# Patient Record
Sex: Female | Born: 1940
Health system: Southern US, Community
[De-identification: ages and names within clinical notes are randomized; demographics above are authoritative.]

---

## 2013-09-11 ENCOUNTER — Observation Stay: Payer: Self-pay | Admitting: Internal Medicine

## 2013-09-11 LAB — COMPREHENSIVE METABOLIC PANEL
Albumin: 3.8 g/dL (ref 3.4–5.0)
Alkaline Phosphatase: 73 U/L (ref 50–136)
Anion Gap: 3 — ABNORMAL LOW (ref 7–16)
Chloride: 106 mmol/L (ref 98–107)
EGFR (African American): >60
EGFR (Non-African Amer.): >60
Osmolality: 280 (ref 275–301)
SGOT(AST): 29 U/L (ref 15–37)

## 2013-09-11 LAB — MAGNESIUM: Magnesium: 1.9 mg/dL

## 2013-09-11 LAB — CBC
HCT: 40.7 % (ref 35.0–47.0)
HGB: 13.6 g/dL (ref 12.0–16.0)
MCHC: 33.3 g/dL (ref 32.0–36.0)
RBC: 4.87 10*6/uL (ref 3.80–5.20)
WBC: 8 10*3/uL (ref 3.6–11.0)

## 2013-09-11 LAB — APTT: Activated PTT: 27 secs (ref 23.6–35.9)

## 2013-09-11 LAB — CALCIUM: Calcium, Total: 9.5 mg/dL (ref 8.5–10.1)

## 2013-09-11 LAB — PROTIME-INR
INR: 1
Prothrombin Time: 13.3 secs (ref 11.5–14.7)

## 2013-09-11 LAB — PRO B NATRIURETIC PEPTIDE: B-Type Natriuretic Peptide: 74 pg/mL (ref 0–125)

## 2013-09-12 DIAGNOSIS — I059 Rheumatic mitral valve disease, unspecified: Secondary | ICD-10-CM

## 2013-09-12 LAB — COMPREHENSIVE METABOLIC PANEL WITH GFR
Albumin: 3.5 g/dL
Alkaline Phosphatase: 70 U/L
Anion Gap: 4 — ABNORMAL LOW
BUN: 19 mg/dL — ABNORMAL HIGH
Bilirubin,Total: 0.4 mg/dL
Calcium, Total: 9.1 mg/dL
Chloride: 104 mmol/L
Co2: 29 mmol/L
Creatinine: 1.17 mg/dL
EGFR (African American): 55 — ABNORMAL LOW
EGFR (Non-African Amer.): 47 — ABNORMAL LOW
Glucose: 292 mg/dL — ABNORMAL HIGH
Osmolality: 287
Potassium: 4 mmol/L
SGOT(AST): 19 U/L
SGPT (ALT): 20 U/L
Sodium: 137 mmol/L
Total Protein: 7.5 g/dL

## 2013-09-12 LAB — CBC WITH DIFFERENTIAL/PLATELET
Basophil #: 0 10*3/uL (ref 0.0–0.1)
Eosinophil #: 0 10*3/uL (ref 0.0–0.7)
Eosinophil %: 0.3 %
HCT: 37.3 % (ref 35.0–47.0)
HGB: 12.4 g/dL (ref 12.0–16.0)
Lymphocyte #: 0.5 10*3/uL — ABNORMAL LOW (ref 1.0–3.6)
Lymphocyte %: 8.3 %
MCH: 27.7 pg (ref 26.0–34.0)
MCHC: 33.1 g/dL (ref 32.0–36.0)
MCV: 84 fL (ref 80–100)
Monocyte #: 0.1 x10 3/mm — ABNORMAL LOW (ref 0.2–0.9)
Monocyte %: 2.1 %
Neutrophil #: 5.8 10*3/uL (ref 1.4–6.5)
Platelet: 146 10*3/uL — ABNORMAL LOW (ref 150–440)
RBC: 4.46 10*6/uL (ref 3.80–5.20)

## 2013-09-12 LAB — TROPONIN I: Troponin-I: 0.05 ng/mL

## 2015-01-28 IMAGING — CT CT CHEST W/ CM
1 series · 16 of 33 positions shown, 20 images · IV contrast (agent unspecified)
Comparison: none

REASON FOR EXAM: dyspnea, PULM EMBOLISM PROTOCOL
COMMENTS:

PROCEDURE:     CT  - CT CHEST WITH CONTRAST  - September 11, 2013  [DATE]
RESULT:     Chest CT PE protocol dated 12/19/2012.
TECHNIQUE: Helical 3 mm sections were obtained from the thoracic inlet to
the lung bases status post intravenous administration of 100 mL of
[id number].

[Series 5: soft tissue · axial · 0.69mm/px · z∈[-2,+298]mm · 16 of 108 slices shown, 20 images]
[im 4/108  mediastinal]
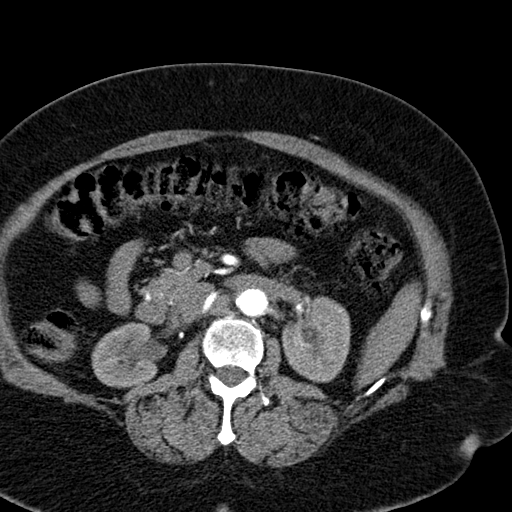
[im 4/108  lung]
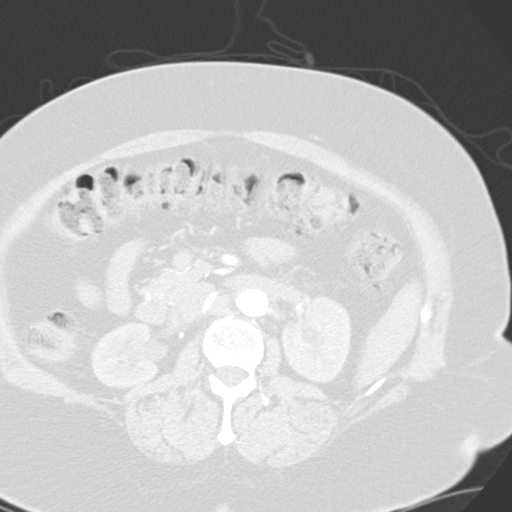
[im 12/108  lung]
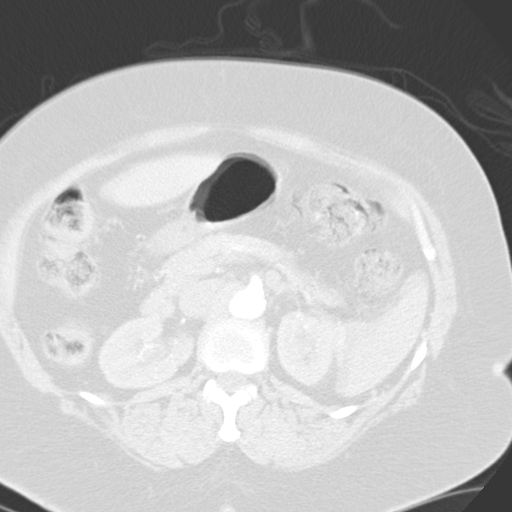
[im 20/108  lung]
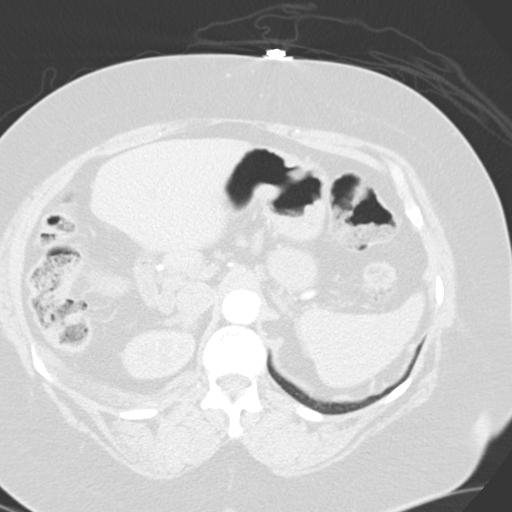
[im 24/108  lung]
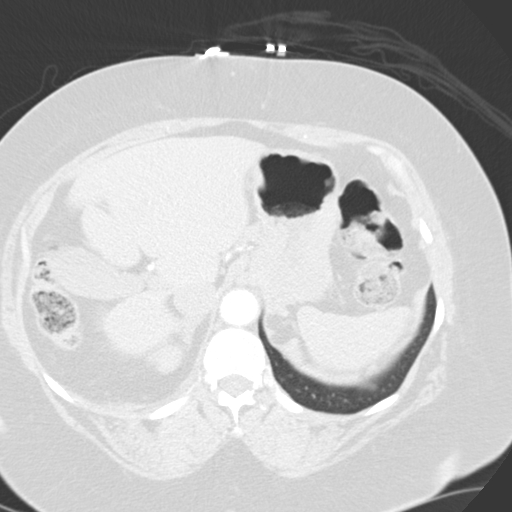
[im 32/108  mediastinal]
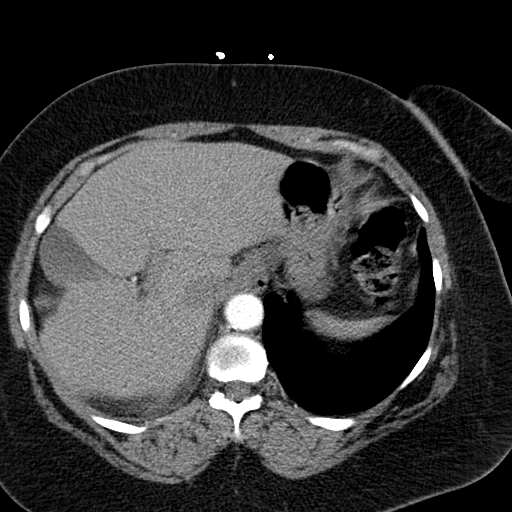
[im 32/108  lung]
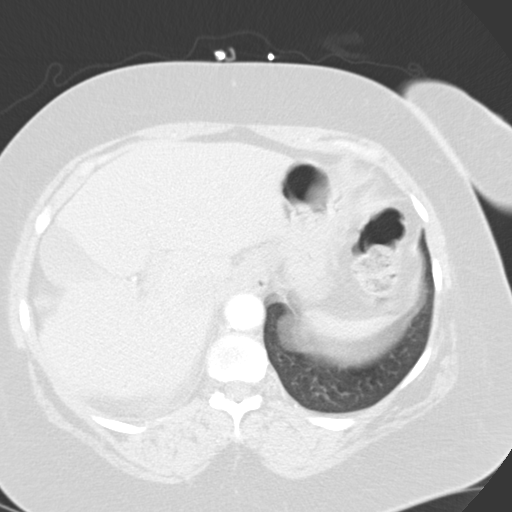
[im 40/108  lung]
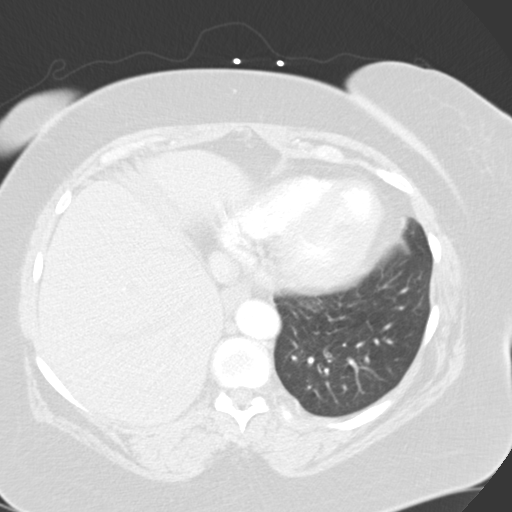
[im 44/108  lung]
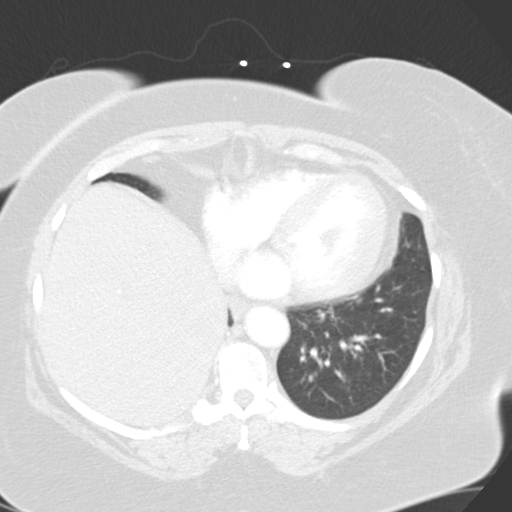
[im 52/108  lung]
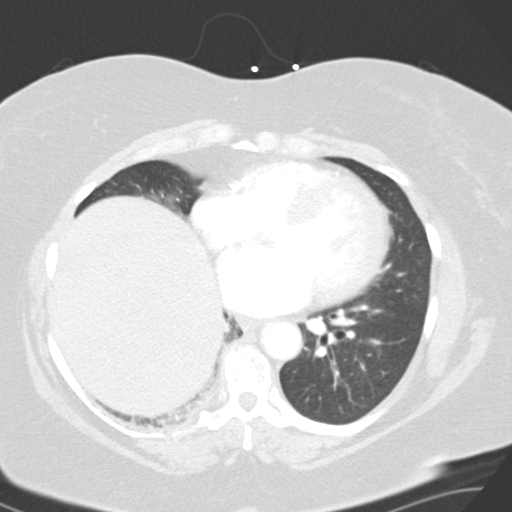
[im 57/108  mediastinal]
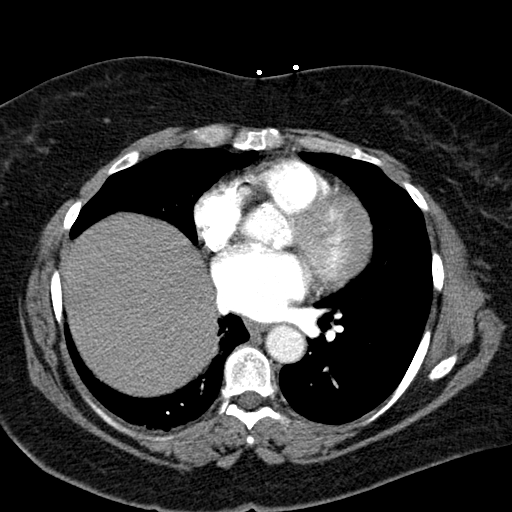
[im 57/108  lung]
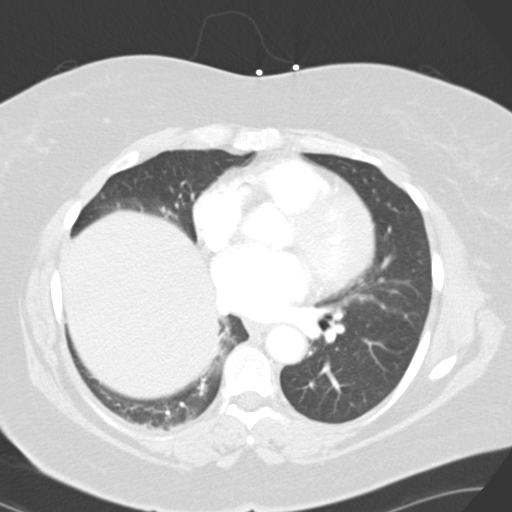
[im 64/108  lung]
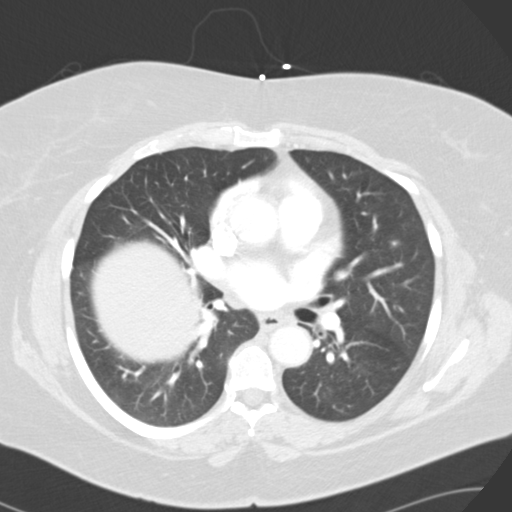
[im 68/108  lung]
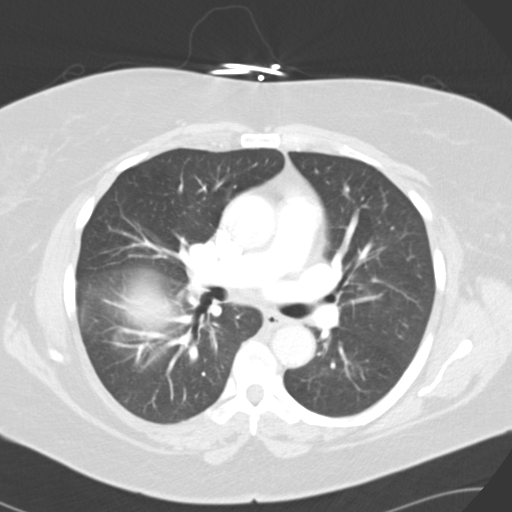
[im 76/108  lung]
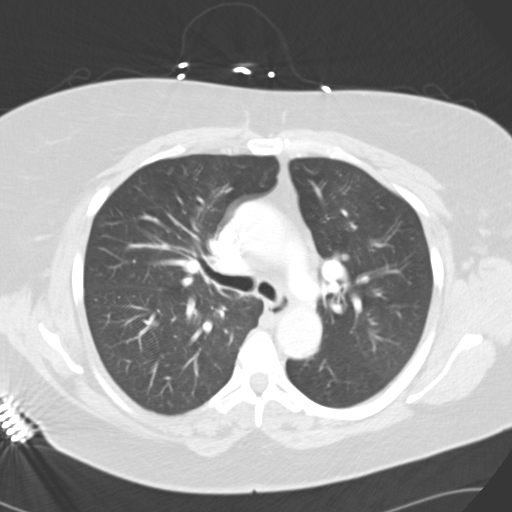
[im 84/108  mediastinal]
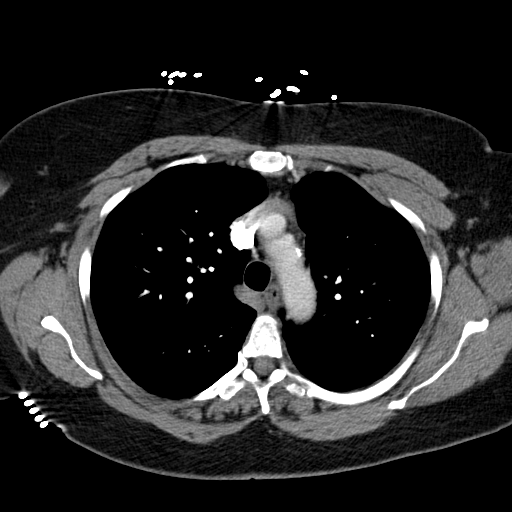
[im 84/108  lung]
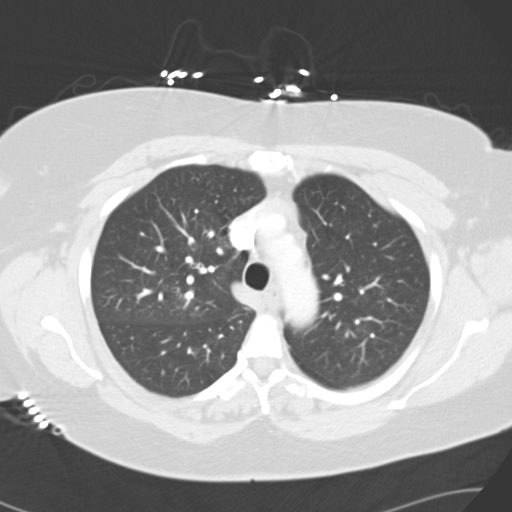
[im 88/108  lung]
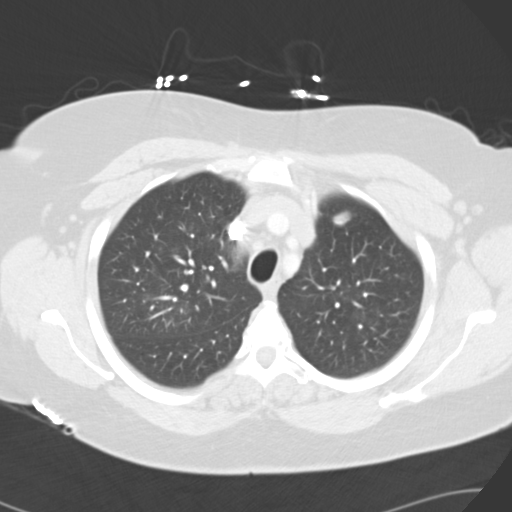
[im 96/108  lung]
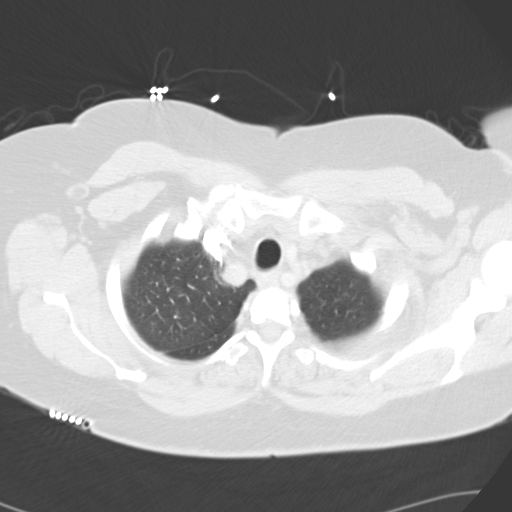
[im 104/108  lung]
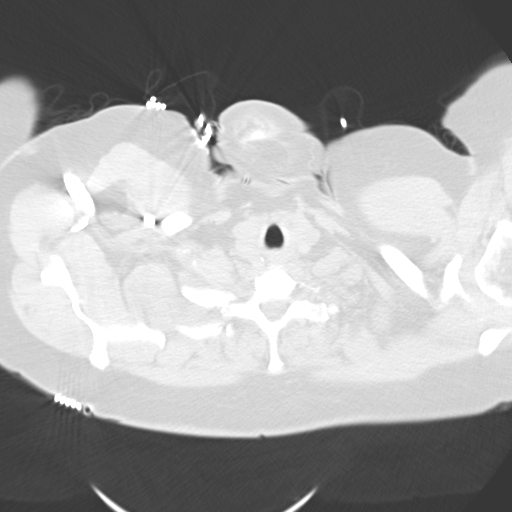

[16 of 33 positions shown; findings below may reference images not displayed]

FINDINGS: There is no evidence of mediastinal or hilar masses, nor
adenopathy.

There is no evidence of filling defects within the main, lobar, or segmental
pulmonary arteries. There is no evidence of a thoracic aortic aneurysm nor
dissection.

The lung parenchyma demonstrates no evidence of focal infiltrates,
effusions, edema, masses, no nodules.

Visualized upper abdominal viscera demonstrate no gross abnormalities.
IMPRESSION: 1. No CT evidence of pulmonary arterial embolic disease.
2. No evidence of focal or acute abnormalities.

## 2015-03-01 NOTE — Discharge Summary (Signed)
PATIENT NAME:  Isabella Lyons, Isabella Lyons MR#:  [id number] DATE OF BIRTH:  26-Aug-1941  DATE OF ADMISSION:  09/11/2013 DATE OF DISCHARGE:  09/12/2013  ADMISSION DIAGNOSES:  1. Acute exacerbation of asthma.  2. Heart murmur.   DISCHARGE DIAGNOSES: 1. Acute exacerbation of asthma.  2. Acute  bronchitis.  3. Diabetes. 4. Cardiac murmur.  5. Epigastric abdominal pain.  6. elevated troponin of 0.06 due to demand ischemia from ASTHMA exacerbation  CONSULTATIONS: None.   IMAGING: The patient had echocardiogram showed a normal ejection fraction with mild mitral valve regurgitation.   LABORATORIES: White blood cells 6.6, hemoglobin 12.4, hematocrit 37.3, platelets 146. Sodium 137, potassium 4.0, chloride 104, bicarbonate 29, BUN 19, creatinine 1.17, glucose is 292. Troponin 0.06 and 0.05.   CT of chest had no PE.   HOSPITAL COURSE: A 74 year old female who presented with acute exacerbation of asthma. For further details, please refer to the H and P.  1. Acute exacerbation of asthma. The patient was started on steroids, antibiotics and nebulizers. The patient actually did quite well. At discharge her lungs are clear to auscultation without any wheezing. She has CT scan of her chest was negative for PE or pneumonia. She will be on a quick steroid taper. Continue azithromycin for acute bronchitis and we will discharge her with Advair. She also had a slightly elevated troponin of 0.06 which is probably due to some demand ischemia.  2. Heart murmur. This is old. Echocardiogram showed some mild MR. As mentioned in her troponin was 0.06 and then 0.05, likely some demand ischemia from problem number one.  3. Diabetes. The patient will continue outpatient medications.  4. Hyperlipidemia. On statin.  5. Hypothyroidism. The patient is on Synthroid.  6. Hypertension. Her blood pressure was controlled.   DISCHARGE MEDICATIONS: 1. Synthroid 125 mcg daily.  2. Hydralazine 25 mg t.i.d.  3. Simvastatin 10 mg at bedtime.  4.  Metformin 500 daily.  5. Prednisone taper starting at 50 mg, taper x 10 mg every day.  6. Azithromycin 500 mg x 3 days.  7. Advair Diskus 100/50 b.i.d.   DISCHARGE DIET: Low sodium, ADA diet.   DISCHARGE ACTIVITY: As tolerated.   DISCHARGE FOLLOW-UP: The patient will need to follow up with her primary care physician, [facility].  TIME SPENT: 35 minutes.   The patient is medically ready for discharge.   ____________________________ Laquan Beier P. Juliene PinaMody, MD spm:sg D: 09/12/2013 14:25:21 ET T: 09/12/2013 15:12:24 ET JOB#: [id number]  cc: Isabella Gautier P. Juliene PinaMody, MD, <Dictator> Gerline LegacySheree Saunder [facility]      Kallan Merrick P Laneta Guerin MD ELECTRONICALLY SIGNED 09/12/2013 20:05

## 2015-03-01 NOTE — H&P (Signed)
PATIENT NAME:  Isabella LoosenMOSS, Eleny MR#:  [id number] DATE OF BIRTH:  1941-07-10  DATE OF ADMISSION:  09/11/2013  PRIMARY CARE PHYSICIAN:  Dr. Tyron RussellSheree [facility]unders [facility].  The is 74 year old African American female with a history of asthma, history of hypertension, hyperlipidemia, diabetes mellitus, who presented to the hospital with complaints of shortness of breath. Apparently, the patient came over here to visit her disabled brother, as well as her mother. Today, in the morning, at around 9:00 to 10:00 a.m., she started helping to clean his apartment or house with chlorine and started having shortness of breath, as well as cough. She denies any phlegm production and denies any significant wheezing, but feels somewhat tight in the chest. She has been having also epigastric abdominal pain, especially with cough. Because of this discomfort, she decided to come to the Emergency Room for further evaluation. She complains of epigastric discomfort as ache-type pain, intermittent, increasing whenever she coughs. She apparently coughed quite a lot in the morning, and on arrival to the Emergency Room, she was also noted to be wheezing. She was given inhalation therapy with some improvement of her condition. She is not producing much sputum. She had a chest x-ray done, which was unremarkable. However, because of her d-dimer elevated, she also had a CT scan of her chest, which showed no pulmonary embolism and no pneumonia. The patient tells me that she had to leave [place] at the age of 74 because of asthma. Each time she comes to [place], she will have asthma exacerbation. Now, it is even worse since she exposed herself to chlorine.   PAST MEDICAL HISTORY: Significant for history of asthma, which started at age around [age] until age of 74; however, whenever she moved out from [place] she felt much better, her breathing somewhat improved. History of hypertension, hyperlipidemia, diabetes mellitus,  hysterectomy in 2001, history of left knee replacement in 2011, right knee replacement in 2012,  cataract removed in 2011, tonsillectomy in 1977, and a history of hypothyroidism.   ALLERGIES: ASPIRIN, AS WELL AS NONSTEROIDAL ANTI-INFLAMMATORY  MEDICATIONS.   FAMILY HISTORY: The patient's mother is [id number], has stroke. She lives in [place]. The patient's sisters, as well as brothers have history of hypertension. The patient has a handicapped brother due to motor vehicle accident, who lives here in [place].     SOCIAL HISTORY: No smoking, alcohol abuse.   REVIEW OF SYSTEMS:   CONSTITUTIONAL:  Positive for pains in the epigastric area with significant cough, as well as shortness of breath, and history of asthma. Denies fever, chills, fatigue, weakness. Admits of having some cough even before she was exposed to chlorine; however, no sputum production. Denies any weight loss or gain.  EYES:  No blurry vision, double vision, glaucoma or cataracts.  ENT: Denies any tinnitus, allergies, epistaxis, sinus pain, dentures, difficulty swallowing. (Dictation Anomaly) PULMONARY, denies significant wheezes, admits of shortness of breath, tightness in lungs.  CARDIOVASCULAR: Denies any chest pains, orthopnea, arrhythmias, palpitations or syncopal events.  GASTROINTESTINAL:  Denies nausea, vomiting, diarrhea or constipation.  GENITOURINARY: No dysuria, hematuria or incontinence.  ENDOCRINOLOGY: Denies any polydipsia, nocturia, thyroid problems, heat or cold intolerance or thirst.  HEMATOLOGIC: Denies anemia, easy bruising, bleeding or swollen glands.  SKIN: Denies any acne, rashes, change in moles.  MUSCULOSKELETAL: Denies arthritis, cramps, swelling, gout.  NEUROLOGIC:  Denies epilepsy or tremor.  PSYCHIATRIC:  Denies anxiety, insomnia.     PHYSICAL EXAMINATION: On arrival to the hospital: VITAL SIGNS: Temperature is  98, pulse was 96, respiration was 18. Blood pressure 161/82. Saturation was 92% on room  air.  GENERAL: This is a well-developed, well-nourished, obese African American female in moderate distress secondary to cough, as well as some shortness of breath, sitting on the stretcher.  HEENT: Pupils are equal, reactive to light. Extraocular muscles intact. No icterus or conjunctivitis.  Has normal hearing. No pharyngeal erythema. Mucosa is moist.  NECK: No masses. Supple, nontender. Thyroid is not enlarged. No adenopathy. No JVD or carotid bruits bilaterally. Full range of motion.  LUNGS: Clear to auscultation in all fields. However, markedly diminished breath sounds were noted. A few rales were heard and wheezing was noted. Labored inspirations as well as increased effort to breathe. No dullness to percussion, in mild to moderate respiratory distress.  CARDIOVASCULAR: S1, S2 appreciated. The patient does have a systolic murmur, mostly in aortic auscultation side. PMI not lateralized.  Chest is nontender to palpation.   1+ pedal pulses.  No lower extremity edema, calf tenderness or cyanosis was noted.  ABDOMEN: Soft, nontender. Bowel sounds are present. No hepatosplenomegaly or masses were noted.  RECTAL: Deferred.  MUSCLE STRENGTH: Able to move all extremities. No cyanosis, degenerative joint disease or kyphosis is present.  SKIN:  Did not reveal any rashes, lesions, erythema, nodularity or induration. It was warm and dry to palpation.  LYMPHATIC: No adenopathy in the cervical region.  NEUROLOGICAL: Cranial nerves grossly intact. Sensory is intact. No dysarthria or aphasia. The patient is alert and oriented to time, person and place, cooperative. Memory is good.  PSYCHIATRIC: No significant confusion, agitation or depression was noted.   The patient's EKG showed sinus rhythm at 77 beats per minute, normal axis. T depressions in lateral leads, but no acute ST-T changes were noted. No comparison to prior EKGs were made, as no EKGs were available.   LABORATORY DATA: BMP showed glucose 141,  otherwise unremarkable. The patient's liver enzymes are normal. Troponin is 0.05. White blood cell count 8.0, hemoglobin 13.6, platelet count 161. Coagulation panel within normal limits. D-dimer is high at 1.26.   RADIOLOGIC STUDIES: Chest x-ray, PA and lateral, 09/11/2013, showed no evidence of cardiopulmonary disease. The patient did have CT scan of her chest done with contrast, 09/11/2013, revealed no CT evidence of pulmonary (Dictation Anomaly) arterial embolic  disease. No evidence of acute focal abnormalities.   ASSESSMENT AND PLAN: 1.  Asthma exacerbation. Admit the patient to the medical floor. Start her on steroids, also Flovent, DuoNeb, as well as oxygen. Discharge her home in the morning, if the patient feels better after ambulation.  2.  Acute bronchitis. We will initiate Zithromax. We will also get sputum cultures if possible.  3.  Diabetes mellitus: We will hold metformin due to CT scan with contrast. We will continue sliding scale insulin.  4.  Cardiac murmur, seems to be old, and no intervention, as the patient is asymptomatic.  5.  Chest pain with cough. Cough due to tightness in the chest very likely. Check cardiac enzymes x 3.   TIME SPENT:  1 hour.     ____________________________ Katharina Caper, MD rv:dmm D: 09/11/2013 21:22:00 ET T: 09/11/2013 22:02:36 ET JOB#: [id number]  cc: Katharina Caper, MD, <Dictator> Tyron Russell, MD  Charleen Kirks MD ELECTRONICALLY SIGNED 10/17/2013 15:15
